# Patient Record
Sex: Male | Born: 1945 | Race: White | Hispanic: No | Smoking: Never smoker
Health system: Southern US, Community
[De-identification: ages and names within clinical notes are randomized; demographics above are authoritative.]

---

## 2001-03-20 ENCOUNTER — Emergency Department (HOSPITAL_COMMUNITY): Admission: EM | Admit: 2001-03-20 | Discharge: 2001-03-20 | Payer: Self-pay | Admitting: Emergency Medicine

## 2001-03-20 ENCOUNTER — Encounter: Payer: Self-pay | Admitting: Emergency Medicine

## 2015-07-03 ENCOUNTER — Other Ambulatory Visit: Payer: Self-pay | Admitting: Family Medicine

## 2015-07-03 DIAGNOSIS — Z136 Encounter for screening for cardiovascular disorders: Secondary | ICD-10-CM

## 2015-07-11 ENCOUNTER — Ambulatory Visit
Admission: RE | Admit: 2015-07-11 | Discharge: 2015-07-11 | Disposition: A | Discharge status: Home / Self Care | Payer: Commercial Managed Care - HMO | Source: Ambulatory Visit | Attending: Family Medicine | Admitting: Family Medicine

## 2015-07-11 DIAGNOSIS — Z136 Encounter for screening for cardiovascular disorders: Secondary | ICD-10-CM

## 2016-05-18 DIAGNOSIS — R69 Illness, unspecified: Secondary | ICD-10-CM | POA: Diagnosis not present

## 2016-07-17 DIAGNOSIS — I10 Essential (primary) hypertension: Secondary | ICD-10-CM | POA: Diagnosis not present

## 2016-07-17 DIAGNOSIS — E785 Hyperlipidemia, unspecified: Secondary | ICD-10-CM | POA: Diagnosis not present

## 2016-07-17 DIAGNOSIS — Z1159 Encounter for screening for other viral diseases: Secondary | ICD-10-CM | POA: Diagnosis not present

## 2016-07-17 DIAGNOSIS — Z1211 Encounter for screening for malignant neoplasm of colon: Secondary | ICD-10-CM | POA: Diagnosis not present

## 2016-07-17 DIAGNOSIS — Z0000 Encounter for general adult medical examination without abnormal findings: Secondary | ICD-10-CM | POA: Diagnosis not present

## 2016-07-17 DIAGNOSIS — Z125 Encounter for screening for malignant neoplasm of prostate: Secondary | ICD-10-CM | POA: Diagnosis not present

## 2016-07-31 DIAGNOSIS — I10 Essential (primary) hypertension: Secondary | ICD-10-CM | POA: Diagnosis not present

## 2016-08-06 IMAGING — US US AORTA SCREENING (MEDICARE)
1 series · 10 of 10 positions shown · non-contrast
Comparison: None.

CLINICAL DATA: Medicare screening exam for abdominal aortic
aneurysm.

EXAM:
ABDOMINAL AORTA SCREENING ULTRASOUND
TECHNIQUE: Ultrasound examination of the abdominal aorta was performed as a
screening evaluation for abdominal aortic aneurysm.

[Series 1: us aorta screening (medicare) · 0.25mm/px · 10 of 10 slices shown]
[im 1/10]
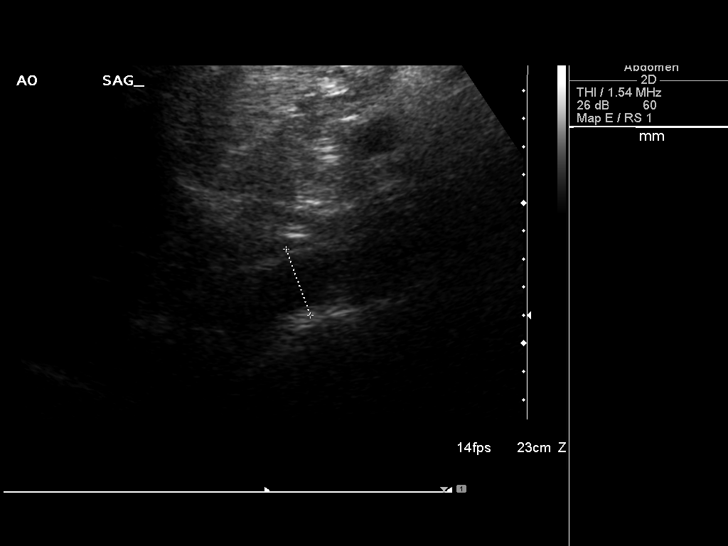
[im 2/10]
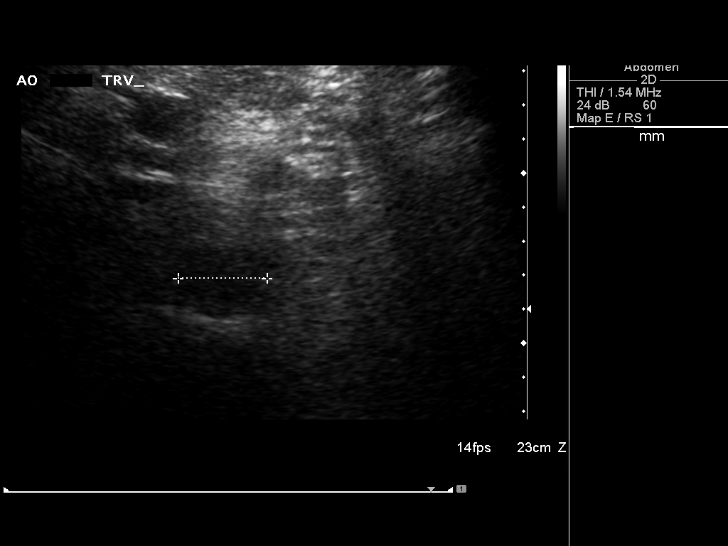
[im 3/10]
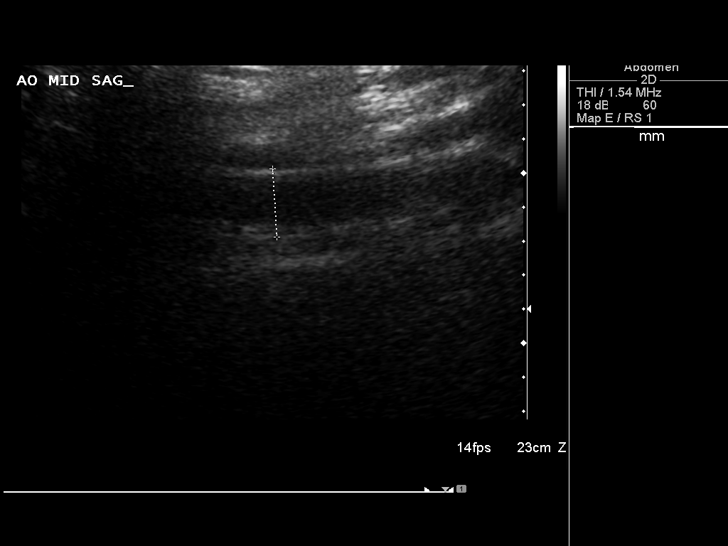
[im 4/10]
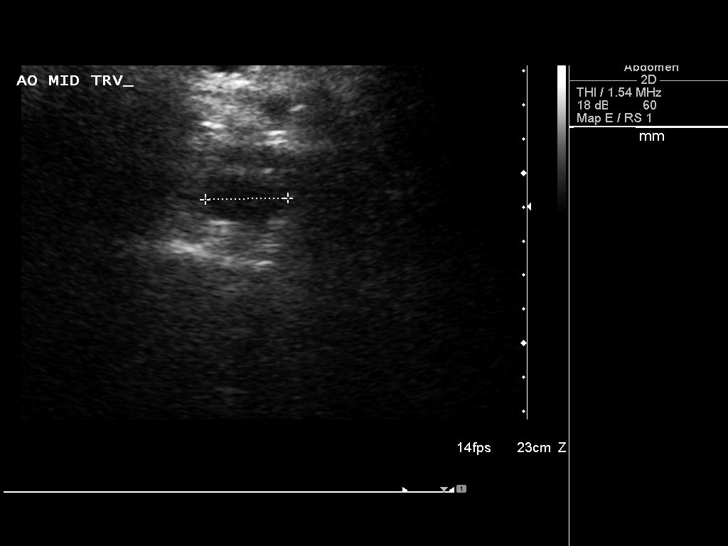
[im 5/10]
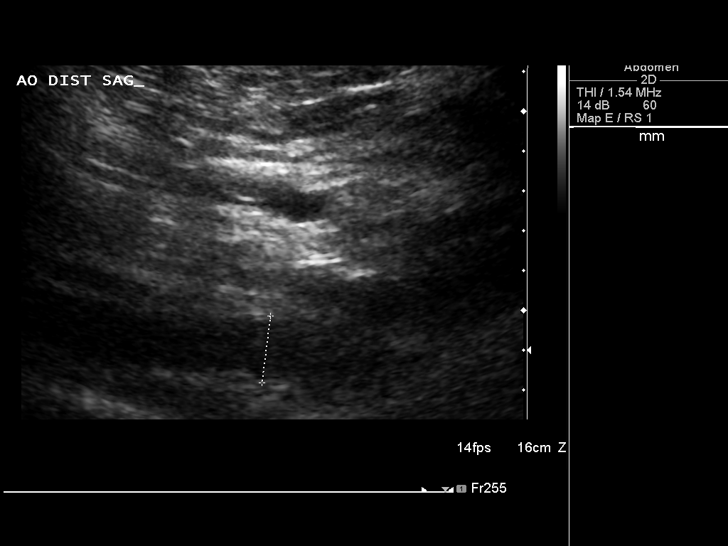
[im 6/10]
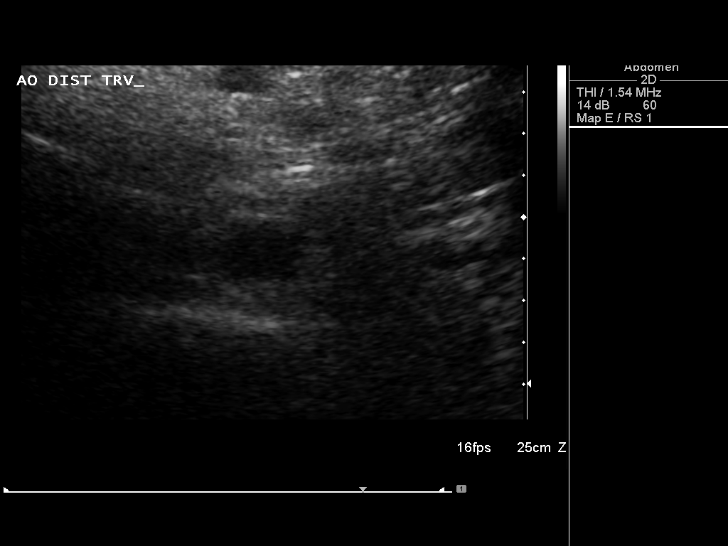
[im 7/10]
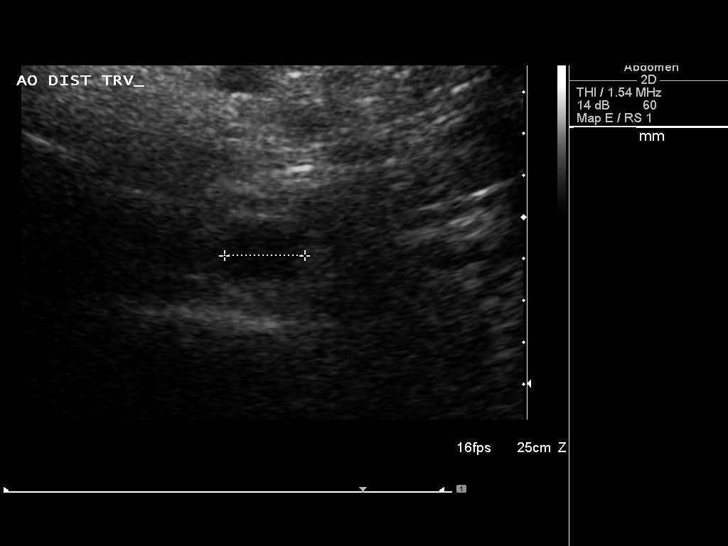
[im 8/10]
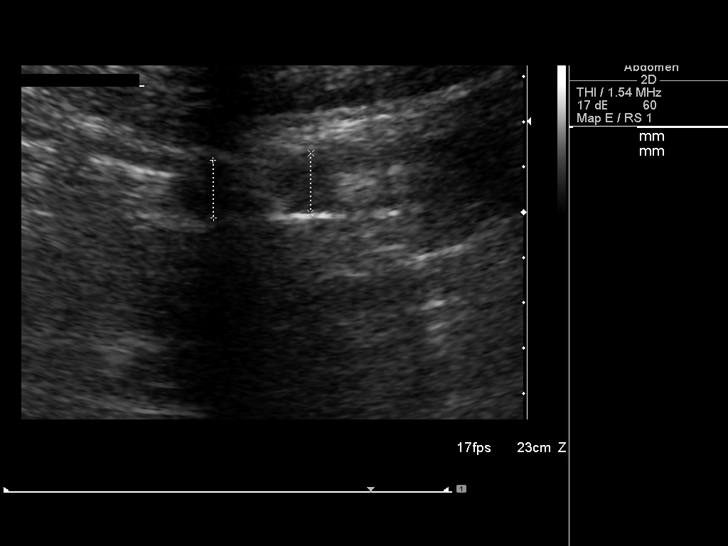
[im 9/10]
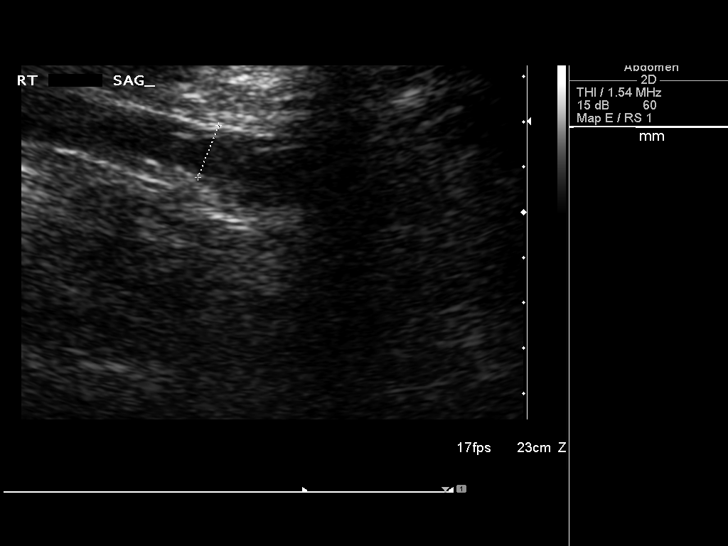
[im 10/10]
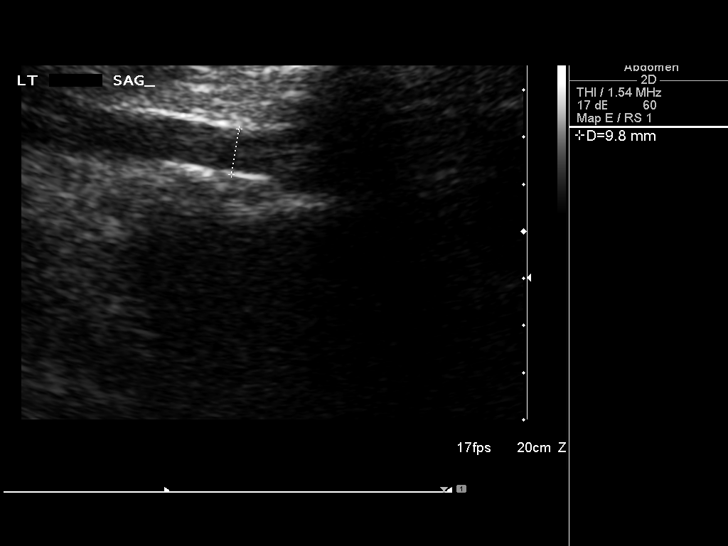

[10 of 10 positions shown; findings below may reference images not displayed]

FINDINGS: Abdominal Aorta

No aneurysm identified.

Maximum Diameter: 2.6 cm transversely seen in the proximal abdominal
aorta.

Right common iliac artery measures 1.3 cm in diameter. Left common
iliac artery measures 1.3 cm in diameter.
IMPRESSION: No evidence of abdominal aortic aneurysm.

## 2016-11-17 DIAGNOSIS — R69 Illness, unspecified: Secondary | ICD-10-CM | POA: Diagnosis not present

## 2016-12-15 DIAGNOSIS — D126 Benign neoplasm of colon, unspecified: Secondary | ICD-10-CM | POA: Diagnosis not present

## 2016-12-15 DIAGNOSIS — Z1211 Encounter for screening for malignant neoplasm of colon: Secondary | ICD-10-CM | POA: Diagnosis not present

## 2016-12-17 DIAGNOSIS — D126 Benign neoplasm of colon, unspecified: Secondary | ICD-10-CM | POA: Diagnosis not present

## 2016-12-17 DIAGNOSIS — Z1211 Encounter for screening for malignant neoplasm of colon: Secondary | ICD-10-CM | POA: Diagnosis not present

## 2017-01-28 DIAGNOSIS — I10 Essential (primary) hypertension: Secondary | ICD-10-CM | POA: Diagnosis not present

## 2017-02-15 DIAGNOSIS — R69 Illness, unspecified: Secondary | ICD-10-CM | POA: Diagnosis not present

## 2017-02-24 DIAGNOSIS — H5200 Hypermetropia, unspecified eye: Secondary | ICD-10-CM | POA: Diagnosis not present

## 2017-05-24 DIAGNOSIS — R69 Illness, unspecified: Secondary | ICD-10-CM | POA: Diagnosis not present

## 2017-10-25 DIAGNOSIS — E7800 Pure hypercholesterolemia, unspecified: Secondary | ICD-10-CM | POA: Diagnosis not present

## 2017-10-25 DIAGNOSIS — I10 Essential (primary) hypertension: Secondary | ICD-10-CM | POA: Diagnosis not present

## 2017-10-25 DIAGNOSIS — Z125 Encounter for screening for malignant neoplasm of prostate: Secondary | ICD-10-CM | POA: Diagnosis not present

## 2017-12-06 DIAGNOSIS — R69 Illness, unspecified: Secondary | ICD-10-CM | POA: Diagnosis not present

## 2018-01-27 DIAGNOSIS — R69 Illness, unspecified: Secondary | ICD-10-CM | POA: Diagnosis not present

## 2018-03-09 DIAGNOSIS — H5203 Hypermetropia, bilateral: Secondary | ICD-10-CM | POA: Diagnosis not present

## 2018-03-09 DIAGNOSIS — H2513 Age-related nuclear cataract, bilateral: Secondary | ICD-10-CM | POA: Diagnosis not present

## 2018-05-17 DIAGNOSIS — Z0000 Encounter for general adult medical examination without abnormal findings: Secondary | ICD-10-CM | POA: Diagnosis not present

## 2018-05-17 DIAGNOSIS — I10 Essential (primary) hypertension: Secondary | ICD-10-CM | POA: Diagnosis not present

## 2018-05-17 DIAGNOSIS — E7800 Pure hypercholesterolemia, unspecified: Secondary | ICD-10-CM | POA: Diagnosis not present

## 2018-06-09 DIAGNOSIS — R69 Illness, unspecified: Secondary | ICD-10-CM | POA: Diagnosis not present

## 2018-06-13 DIAGNOSIS — R69 Illness, unspecified: Secondary | ICD-10-CM | POA: Diagnosis not present

## 2018-06-28 DIAGNOSIS — R69 Illness, unspecified: Secondary | ICD-10-CM | POA: Diagnosis not present

## 2018-11-22 DIAGNOSIS — I10 Essential (primary) hypertension: Secondary | ICD-10-CM | POA: Diagnosis not present

## 2018-11-22 DIAGNOSIS — E7800 Pure hypercholesterolemia, unspecified: Secondary | ICD-10-CM | POA: Diagnosis not present

## 2018-12-15 DIAGNOSIS — R69 Illness, unspecified: Secondary | ICD-10-CM | POA: Diagnosis not present

## 2019-01-27 DIAGNOSIS — R69 Illness, unspecified: Secondary | ICD-10-CM | POA: Diagnosis not present

## 2019-05-22 DIAGNOSIS — Z0000 Encounter for general adult medical examination without abnormal findings: Secondary | ICD-10-CM | POA: Diagnosis not present

## 2019-05-22 DIAGNOSIS — E7800 Pure hypercholesterolemia, unspecified: Secondary | ICD-10-CM | POA: Diagnosis not present

## 2019-05-22 DIAGNOSIS — Z125 Encounter for screening for malignant neoplasm of prostate: Secondary | ICD-10-CM | POA: Diagnosis not present

## 2019-05-22 DIAGNOSIS — I10 Essential (primary) hypertension: Secondary | ICD-10-CM | POA: Diagnosis not present

## 2019-06-01 DIAGNOSIS — I10 Essential (primary) hypertension: Secondary | ICD-10-CM | POA: Diagnosis not present

## 2019-06-01 DIAGNOSIS — E7800 Pure hypercholesterolemia, unspecified: Secondary | ICD-10-CM | POA: Diagnosis not present

## 2019-06-01 DIAGNOSIS — Z125 Encounter for screening for malignant neoplasm of prostate: Secondary | ICD-10-CM | POA: Diagnosis not present

## 2019-08-08 DIAGNOSIS — R69 Illness, unspecified: Secondary | ICD-10-CM | POA: Diagnosis not present

## 2019-11-09 DIAGNOSIS — L308 Other specified dermatitis: Secondary | ICD-10-CM | POA: Diagnosis not present

## 2019-11-09 DIAGNOSIS — M5432 Sciatica, left side: Secondary | ICD-10-CM | POA: Diagnosis not present

## 2019-11-09 DIAGNOSIS — I10 Essential (primary) hypertension: Secondary | ICD-10-CM | POA: Diagnosis not present

## 2019-12-25 DIAGNOSIS — R69 Illness, unspecified: Secondary | ICD-10-CM | POA: Diagnosis not present

## 2020-02-15 DIAGNOSIS — R69 Illness, unspecified: Secondary | ICD-10-CM | POA: Diagnosis not present

## 2020-03-28 DIAGNOSIS — H5200 Hypermetropia, unspecified eye: Secondary | ICD-10-CM | POA: Diagnosis not present

## 2020-03-28 DIAGNOSIS — Z0100 Encounter for examination of eyes and vision without abnormal findings: Secondary | ICD-10-CM | POA: Diagnosis not present

## 2020-03-28 DIAGNOSIS — H25813 Combined forms of age-related cataract, bilateral: Secondary | ICD-10-CM | POA: Diagnosis not present

## 2020-03-29 ENCOUNTER — Ambulatory Visit: Payer: Medicare HMO | Attending: Internal Medicine

## 2020-03-29 DIAGNOSIS — Z23 Encounter for immunization: Secondary | ICD-10-CM

## 2020-03-29 NOTE — Progress Notes (Signed)
   Covid-19 Vaccination Clinic  Name:  EVERITT WENNER    MRN: [id number] DOB: Apr 16, 1946  03/29/2020  Mr. Stene was observed post Covid-19 immunization for 15 minutes without incident. He was provided with Vaccine Information Sheet and instruction to access the V-Safe system.   Mr. Tafoya was instructed to call 911 with any severe reactions post vaccine: Marland Kitchen Difficulty breathing  . Swelling of face and throat  . A fast heartbeat  . A bad rash all over body  . Dizziness and weakness   Immunizations Administered    Name Date Dose VIS Date Route   Pfizer COVID-19 Vaccine 03/29/2020  2:43 PM 0.3 mL 02/14/2020 Intramuscular   Manufacturer: [place]   Lot: [id number]   NDC: [address]-9

## 2020-05-23 DIAGNOSIS — I10 Essential (primary) hypertension: Secondary | ICD-10-CM | POA: Diagnosis not present

## 2020-05-23 DIAGNOSIS — Z125 Encounter for screening for malignant neoplasm of prostate: Secondary | ICD-10-CM | POA: Diagnosis not present

## 2020-05-23 DIAGNOSIS — E7800 Pure hypercholesterolemia, unspecified: Secondary | ICD-10-CM | POA: Diagnosis not present

## 2020-05-23 DIAGNOSIS — M5432 Sciatica, left side: Secondary | ICD-10-CM | POA: Diagnosis not present

## 2020-06-14 DIAGNOSIS — G8929 Other chronic pain: Secondary | ICD-10-CM | POA: Diagnosis not present

## 2020-06-14 DIAGNOSIS — M5442 Lumbago with sciatica, left side: Secondary | ICD-10-CM | POA: Diagnosis not present

## 2020-06-14 DIAGNOSIS — M5441 Lumbago with sciatica, right side: Secondary | ICD-10-CM | POA: Diagnosis not present

## 2020-06-25 DIAGNOSIS — R269 Unspecified abnormalities of gait and mobility: Secondary | ICD-10-CM | POA: Diagnosis not present

## 2020-06-25 DIAGNOSIS — M545 Low back pain, unspecified: Secondary | ICD-10-CM | POA: Diagnosis not present

## 2020-06-25 DIAGNOSIS — M5442 Lumbago with sciatica, left side: Secondary | ICD-10-CM | POA: Diagnosis not present

## 2020-07-04 DIAGNOSIS — R269 Unspecified abnormalities of gait and mobility: Secondary | ICD-10-CM | POA: Diagnosis not present

## 2020-07-04 DIAGNOSIS — M5442 Lumbago with sciatica, left side: Secondary | ICD-10-CM | POA: Diagnosis not present

## 2020-07-04 DIAGNOSIS — M545 Low back pain, unspecified: Secondary | ICD-10-CM | POA: Diagnosis not present

## 2021-07-01 DIAGNOSIS — B354 Tinea corporis: Secondary | ICD-10-CM | POA: Diagnosis not present

## 2021-07-01 DIAGNOSIS — I10 Essential (primary) hypertension: Secondary | ICD-10-CM | POA: Diagnosis not present

## 2021-07-21 DIAGNOSIS — I10 Essential (primary) hypertension: Secondary | ICD-10-CM | POA: Diagnosis not present

## 2021-07-21 DIAGNOSIS — E7800 Pure hypercholesterolemia, unspecified: Secondary | ICD-10-CM | POA: Diagnosis not present

## 2022-01-09 DIAGNOSIS — B360 Pityriasis versicolor: Secondary | ICD-10-CM | POA: Diagnosis not present

## 2022-01-09 DIAGNOSIS — I10 Essential (primary) hypertension: Secondary | ICD-10-CM | POA: Diagnosis not present

## 2022-01-09 DIAGNOSIS — Z0000 Encounter for general adult medical examination without abnormal findings: Secondary | ICD-10-CM | POA: Diagnosis not present

## 2022-05-08 DIAGNOSIS — L209 Atopic dermatitis, unspecified: Secondary | ICD-10-CM | POA: Diagnosis not present

## 2022-06-10 DIAGNOSIS — H52223 Regular astigmatism, bilateral: Secondary | ICD-10-CM | POA: Diagnosis not present

## 2022-07-01 DIAGNOSIS — Z86010 Personal history of colonic polyps: Secondary | ICD-10-CM | POA: Diagnosis not present

## 2022-07-01 DIAGNOSIS — R21 Rash and other nonspecific skin eruption: Secondary | ICD-10-CM | POA: Diagnosis not present

## 2022-07-03 DIAGNOSIS — R21 Rash and other nonspecific skin eruption: Secondary | ICD-10-CM | POA: Diagnosis not present

## 2022-07-10 DIAGNOSIS — E7800 Pure hypercholesterolemia, unspecified: Secondary | ICD-10-CM | POA: Diagnosis not present

## 2022-07-10 DIAGNOSIS — I10 Essential (primary) hypertension: Secondary | ICD-10-CM | POA: Diagnosis not present

## 2022-07-10 DIAGNOSIS — L209 Atopic dermatitis, unspecified: Secondary | ICD-10-CM | POA: Diagnosis not present

## 2022-07-14 ENCOUNTER — Ambulatory Visit: Payer: Medicare HMO | Admitting: Dermatology

## 2022-07-14 ENCOUNTER — Other Ambulatory Visit: Payer: Self-pay | Admitting: Dermatology

## 2022-07-14 ENCOUNTER — Encounter: Payer: Self-pay | Admitting: Dermatology

## 2022-07-14 VITALS — BP 133/84 | HR 88

## 2022-07-14 DIAGNOSIS — L299 Pruritus, unspecified: Secondary | ICD-10-CM

## 2022-07-14 DIAGNOSIS — R21 Rash and other nonspecific skin eruption: Secondary | ICD-10-CM | POA: Diagnosis not present

## 2022-07-14 MED ORDER — TRIAMCINOLONE ACETONIDE 0.1 % EX CREA: TOPICAL_CREAM | CUTANEOUS | 0 refills | Status: DC

## 2022-07-14 NOTE — Patient Instructions (Addendum)
Treatment to start today - Start TMC 0.1% cream twice daily for up to 3 weeks. Avoid applying to face, groin, and axilla. Use as directed. Long-term use can cause thinning of the skin.  Topical steroids (such as triamcinolone, fluocinolone, fluocinonide, mometasone, clobetasol, halobetasol, betamethasone, hydrocortisone) can cause thinning and lightening of the skin if they are used for too long in the same area. Your physician has selected the right strength medicine for your problem and area affected on the body. Please use your medication only as directed by your physician to prevent side effects.       Due to recent changes in healthcare laws, you may see results of your pathology and/or laboratory studies on MyChart before the doctors have had a chance to review them. We understand that in some cases there may be results that are confusing or concerning to you. Please understand that not all results are received at the same time and often the doctors may need to interpret multiple results in order to provide you with the best plan of care or course of treatment. Therefore, we ask that you please give [place] 2 business days to thoroughly review all your results before contacting the office for clarification. Should we see a critical lab result, you will be contacted sooner.   If You Need Anything After Your Visit  If you have any questions or concerns for your doctor, please call our main line at [phone] If no one answers, please leave a voicemail as directed and we will return your call as soon as possible. Messages left after 4 pm will be answered the following business day.   You may also send [place] a message via [place]. We typically respond to MyChart messages within 1-2 business days.  For prescription refills, please ask your pharmacy to contact our office. Our fax number is [phone].  If you have an urgent issue when the clinic is closed that cannot wait until the next business day,  you can page your doctor at the number below.    Please note that while we do our best to be available for urgent issues outside of office hours, we are not available 24/7.   If you have an urgent issue and are unable to reach [place], you may choose to seek medical care at your doctor's office, retail clinic, urgent care center, or emergency room.  If you have a medical emergency, please immediately call 911 or go to the emergency department. In the event of inclement weather, please call our main line at [phone] for an update on the status of any delays or closures.  Dermatology Medication Tips: Please keep the boxes that topical medications come in in order to help keep track of the instructions about where and how to use these. Pharmacies typically print the medication instructions only on the boxes and not directly on the medication tubes.   If your medication is too expensive, please contact our office at [phone] or send [place] a message through [place].   We are unable to tell what your co-pay for medications will be in advance as this is different depending on your insurance coverage. However, we may be able to find a substitute medication at lower cost or fill out paperwork to get insurance to cover a needed medication.   If a prior authorization is required to get your medication covered by your insurance company, please allow [place] 1-2 business days to complete this process.  Drug prices often vary depending on where  the prescription is filled and some pharmacies may offer cheaper prices.  The website www.goodrx.com contains coupons for medications through different pharmacies. The prices here do not account for what the cost may be with help from insurance (it may be cheaper with your insurance), but the website can give you the price if you did not use any insurance.  - You can print the associated coupon and take it with your prescription to the pharmacy.  - You may also stop by our  office during regular business hours and pick up a GoodRx coupon card.  - If you need your prescription sent electronically to a different pharmacy, notify our office through [facility] or by phone at [phone]

## 2022-07-14 NOTE — Progress Notes (Unsigned)
   New Patient Visit  Subjective  Ricky Rose is a 77 y.o. male who presents for the following: Rash (Chest, neck, arms and legs since last fall, started at legs and has spread, itchy. Patient has been seeing PCP and has been on prednisone, Xyzal, ketoconazole tablets, hydroxyzine. PCP had patient stop all medications a week ago this past Friday but then restarted lisinopril last Friday. Using CeraVe topically. ).  On a scale from 1-10 patient advises itching is close to a 10. No recent weight loss, no fhx psoriasis.  The following portions of the chart were reviewed this encounter and updated as appropriate:   Allergies  Meds  Med Hx  Surg Hx      Review of Systems:  No other skin or systemic complaints except as noted in HPI or Assessment and Plan.  Objective  Well appearing patient in no apparent distress; mood and affect are within normal limits.  [id number] full examination was performed including scalp, head, eyes, ears, nose, lips, neck, chest, axillae, abdomen, back, buttocks, bilateral upper extremities, bilateral lower extremities, hands, feet, fingers, toes, fingernails, and toenails. All findings within normal limits unless otherwise noted below."}  right abdomen Diffuse erythema and scale     arms, trunk, legs, face           Assessment & Plan  Rash right abdomen  Pending biopsy results consider biologic vs Dupixent vs other.     Skin / nail biopsy - right abdomen Type of biopsy: punch   Informed consent: discussed and consent obtained   Timeout: patient name, date of birth, surgical site, and procedure verified   Procedure prep:  Patient was prepped and draped in usual sterile fashion (the patient was cleaned and prepped) Prep type:  Isopropyl alcohol Anesthesia: the lesion was anesthetized in a standard fashion   Anesthetic:  1% lidocaine w/ epinephrine 1-100,000 buffered w/ 8.4% NaHCO3 Punch size:  4 mm Suture size:  4-0 Suture type:  nylon   Suture removal (days):  14 Hemostasis achieved with: suture, pressure and aluminum chloride   Outcome: patient tolerated procedure well   Post-procedure details: sterile dressing applied and wound care instructions given   Dressing type: bandage, petrolatum and pressure dressing    Specimen 1 - Surgical pathology Differential Diagnosis: Atopic dermatitis vs erythrodermic psoriasis vs CTCL  Check Margins: No Diffuse erythema and scale  Pruritus arms, trunk, legs, face  Treatment to start today - Start TMC 0.1% cream twice daily for up to 3 weeks. Avoid applying to face, groin, and axilla. Use as directed. Long-term use can cause thinning of the skin.  Topical steroids (such as triamcinolone, fluocinolone, fluocinonide, mometasone, clobetasol, halobetasol, betamethasone, hydrocortisone) can cause thinning and lightening of the skin if they are used for too long in the same area. Your physician has selected the right strength medicine for your problem and area affected on the body. Please use your medication only as directed by your physician to prevent side effects.    triamcinolone cream (KENALOG) 0.1 % - arms, trunk, legs, face Apply twice daily as needed for itch for up to 3 weeks. Avoid applying to face, groin, and axilla. Use as directed. Long-term use can cause thinning of the skin.  Related Procedures CBC with Differential/Platelets CMP IGE QuantiFERON-TB Gold Plus   Return in about 2 weeks (around 07/28/2022) for Suture Removal.  Graciella Belton, RMA, am acting as scribe for Ellard Artis, MD .

## 2022-07-16 ENCOUNTER — Encounter: Payer: Self-pay | Admitting: Dermatology

## 2022-07-17 LAB — CBC WITH DIFFERENTIAL/PLATELET
Basophils Absolute: 0 10*3/uL (ref 0.0–0.2)
Basos: 0 %
EOS (ABSOLUTE): 0.2 10*3/uL (ref 0.0–0.4)
Eos: 2 %
Hematocrit: 49.6 % (ref 37.5–51.0)
Hemoglobin: 16.6 g/dL (ref 13.0–17.7)
Immature Grans (Abs): 0 10*3/uL (ref 0.0–0.1)
Immature Granulocytes: 0 %
Lymphocytes Absolute: 0.9 10*3/uL (ref 0.7–3.1)
Lymphs: 10 %
MCH: 29.4 pg (ref 26.6–33.0)
MCHC: 33.5 g/dL (ref 31.5–35.7)
MCV: 88 fL (ref 79–97)
Monocytes Absolute: 0.9 10*3/uL (ref 0.1–0.9)
Monocytes: 11 %
Neutrophils Absolute: 6.2 10*3/uL (ref 1.4–7.0)
Neutrophils: 77 %
Platelets: 236 10*3/uL (ref 150–450)
RBC: 5.65 x10E6/uL (ref 4.14–5.80)
RDW: 14.3 % (ref 11.6–15.4)
WBC: 8.2 10*3/uL (ref 3.4–10.8)

## 2022-07-17 LAB — COMPREHENSIVE METABOLIC PANEL
ALT: 55 IU/L — ABNORMAL HIGH (ref 0–44)
AST: 54 IU/L — ABNORMAL HIGH (ref 0–40)
Albumin/Globulin Ratio: 1.6 (ref 1.2–2.2)
Albumin: 4.6 g/dL (ref 3.8–4.8)
Alkaline Phosphatase: 100 IU/L (ref 44–121)
BUN/Creatinine Ratio: 13 (ref 10–24)
BUN: 15 mg/dL (ref 8–27)
Bilirubin Total: 0.5 mg/dL (ref 0.0–1.2)
CO2: 19 mmol/L — ABNORMAL LOW (ref 20–29)
Calcium: 10.1 mg/dL (ref 8.6–10.2)
Chloride: 105 mmol/L (ref 96–106)
Creatinine, Ser: 1.19 mg/dL (ref 0.76–1.27)
Globulin, Total: 2.8 g/dL (ref 1.5–4.5)
Glucose: 100 mg/dL — ABNORMAL HIGH (ref 70–99)
Potassium: 5.5 mmol/L — ABNORMAL HIGH (ref 3.5–5.2)
Sodium: 147 mmol/L — ABNORMAL HIGH (ref 134–144)
Total Protein: 7.4 g/dL (ref 6.0–8.5)
eGFR: 63 mL/min/{1.73_m2} (ref >59)

## 2022-07-17 LAB — REFERENCE MICRO PROBLEM TEST

## 2022-07-17 LAB — QUANTIFERON-TB GOLD PLUS

## 2022-07-17 LAB — IGE: IgE (Immunoglobulin E), Serum: 650 IU/mL — ABNORMAL HIGH (ref 6–495)

## 2022-07-29 ENCOUNTER — Ambulatory Visit: Payer: Medicare HMO | Admitting: Dermatology

## 2022-07-29 ENCOUNTER — Encounter: Payer: Self-pay | Admitting: Dermatology

## 2022-07-29 DIAGNOSIS — L309 Dermatitis, unspecified: Secondary | ICD-10-CM

## 2022-07-29 MED ORDER — PIMECROLIMUS 1 % EX CREA: TOPICAL_CREAM | Freq: Two times a day (BID) | CUTANEOUS | 0 refills | Status: DC

## 2022-07-29 NOTE — Progress Notes (Signed)
   Follow-Up Visit   Subjective  Ricky Rose is a 77 y.o. male who presents for the following: Follow-up (Patient is here for a follow up on a rash. The itchiness and redness has gotten better with use of the Triamcinolone cream. ) and Suture / Staple Removal (Suture removal from last office vist).    The following portions of the chart were reviewed this encounter and updated as appropriate:      Review of Systems: No other skin or systemic complaints.  Objective  Well appearing patient in no apparent distress; mood and affect are within normal limits.  All skin waist up examined.  PATHOLOGY REPORT Diagnosis Skin , right abdomen ATYPICAL MONONUCLEAR CELL INFILTRATE Microscopic Description There is a proliferation of mononuclear cells in a slightly thickened papillary dermis. The cells focally abut the basement membranes and an occasional cell is present within the epidermis where there is a little spongiosis. These mononuclear cells have slight nuclear hyperchromaticity but no prominent atypia. Immunohistochemistry stains for CD3, CD4 and CD8 shows a mixed infiltrate. The CD20 is negative. No diagnostic changes of psoriasis are noted. Multiple levels taken through the submitted block are examined. The slides were also reviewed by Dr. Dalia Heading, who is in general agreement with the diagnosis. 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 Similar changes, however, can be secondary to some drug eruptions. If these lesions continue to progress or new lesions appear, perhaps additional biopsies will reveal more diagnostic features. A T-cell gene rearrangement is advised.   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  The other differential dx was drug eruption.  Pt's skin is almost completely clear after applying Triamcinolone for 2 weeks.  I recommend he complete 1 more week of triamcinolone and then follow up with tacrolimus for any small unresolved areas.  He can use tacrolimus daily on any residual itchy areas until clear.  If there is no recurrence then the diagnosis leans toward drug eruption and if there are future recurrence we will do a repeat skin bx's since MF can be one of those diagnoses that takes time to "fully present itself"  We will see him back in 6 weeks for an update. -Continue Triamcinolone cream twice a day for one more week to affected areas -Start the Elodil 1% cream twice a day for four weeks to affected areas  Biopsy Results/Suture Removal Sutures were removed from punch biopsy performed at last office visit. Patient tolerated procedure well with no complications. Biopsy results were discussed with patient. Patient verbally understood the diagnosis   Return in about 6 weeks (around 09/09/2022) for rash follow up.  I, Germaine Pomfret, CMA, am acting as scribe for Langston Reusing, MD.

## 2022-07-29 NOTE — Patient Instructions (Addendum)
Continue Triamcinolone cream twice a day for one more week then switch to Elidel cream twice a day for four weeks  Due to recent changes in healthcare laws, you may see results of your pathology and/or laboratory studies on MyChart before the doctors have had a chance to review them. We understand that in some cases there may be results that are confusing or concerning to you. Please understand that not all results are received at the same time and often the doctors may need to interpret multiple results in order to provide you with the best plan of care or course of treatment. Therefore, we ask that you please give [place] 2 business days to thoroughly review all your results before contacting the office for clarification. Should we see a critical lab result, you will be contacted sooner.   If You Need Anything After Your Visit  If you have any questions or concerns for your doctor, please call our main line at [phone] If no one answers, please leave a voicemail as directed and we will return your call as soon as possible. Messages left after 4 pm will be answered the following business day.   You may also send [place] a message via [place]. We typically respond to MyChart messages within 1-2 business days.  For prescription refills, please ask your pharmacy to contact our office. Our fax number is [phone].  If you have an urgent issue when the clinic is closed that cannot wait until the next business day, you can page your doctor at the number below.    Please note that while we do our best to be available for urgent issues outside of office hours, we are not available 24/7.   If you have an urgent issue and are unable to reach [place], you may choose to seek medical care at your doctor's office, retail clinic, urgent care center, or emergency room.  If you have a medical emergency, please immediately call 911 or go to the emergency department. In the event of inclement weather, please call our main  line at [phone] for an update on the status of any delays or closures.  Dermatology Medication Tips: Please keep the boxes that topical medications come in in order to help keep track of the instructions about where and how to use these. Pharmacies typically print the medication instructions only on the boxes and not directly on the medication tubes.   If your medication is too expensive, please contact our office at [phone] or send [place] a message through [place].   We are unable to tell what your co-pay for medications will be in advance as this is different depending on your insurance coverage. However, we may be able to find a substitute medication at lower cost or fill out paperwork to get insurance to cover a needed medication.   If a prior authorization is required to get your medication covered by your insurance company, please allow [place] 1-2 business days to complete this process.  Drug prices often vary depending on where the prescription is filled and some pharmacies may offer cheaper prices.  The website www.goodrx.com contains coupons for medications through different pharmacies. The prices here do not account for what the cost may be with help from insurance (it may be cheaper with your insurance), but the website can give you the price if you did not use any insurance.  - You can print the associated coupon and take it with your prescription to the pharmacy.  - You may  also stop by our office during regular business hours and pick up a GoodRx coupon card.  - If you need your prescription sent electronically to a different pharmacy, notify our office through [facility] or by phone at [phone]

## 2022-08-05 ENCOUNTER — Other Ambulatory Visit: Payer: Self-pay | Admitting: Dermatology

## 2022-08-06 ENCOUNTER — Telehealth: Payer: Self-pay | Admitting: Dermatology

## 2022-08-06 NOTE — Telephone Encounter (Signed)
Yes, ok to change to tacrolimus 0.1% ointment.

## 2022-08-06 NOTE — Telephone Encounter (Signed)
Needs an alternative RX called in to [facility] on [address]- message did not specify which RX.

## 2022-08-06 NOTE — Telephone Encounter (Signed)
Yes, we received a fax about this today and sent over a new script.  I think this was an older message :)

## 2022-08-12 NOTE — Progress Notes (Signed)
This note is for documentation purposes. Pt is aware of results as they were reviewed in office with him after having the pathology report faxed directly to the office.  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  T-cell gene rearrangement was recommended.  I called the pathologist and confirmed this could be performed with the current specimen thus no repeat bx is necessary.

## 2022-09-08 ENCOUNTER — Encounter: Payer: Self-pay | Admitting: Dermatology

## 2022-09-09 ENCOUNTER — Encounter: Payer: Self-pay | Admitting: Dermatology

## 2022-09-09 ENCOUNTER — Ambulatory Visit: Payer: Medicare HMO | Admitting: Dermatology

## 2022-09-09 VITALS — BP 124/83

## 2022-09-09 DIAGNOSIS — L219 Seborrheic dermatitis, unspecified: Secondary | ICD-10-CM

## 2022-09-09 DIAGNOSIS — L309 Dermatitis, unspecified: Secondary | ICD-10-CM | POA: Diagnosis not present

## 2022-09-09 MED ORDER — PREDNISONE 10 MG PO TABS: ORAL_TABLET | ORAL | 0 refills | Status: AC

## 2022-09-09 MED ORDER — NYSTATIN-TRIAMCINOLONE 100000-0.1 UNIT/GM-% EX OINT: 1.0000 | TOPICAL_OINTMENT | Freq: Two times a day (BID) | CUTANEOUS | 0 refills | Status: DC

## 2022-09-09 MED ORDER — TRIAMCINOLONE ACETONIDE 0.1 % EX CREA: 1.0000 | TOPICAL_CREAM | Freq: Two times a day (BID) | CUTANEOUS | 0 refills | Status: DC

## 2022-09-09 NOTE — Patient Instructions (Addendum)
Treatment Plan: Resume Triamcinolone cream 2 x daily for 2 weeks then stop. He can use Cerave anti-itch cream after stopping.  Discontinue Lisinopril for 1 month. He has a blood pressure cuff at home and should monitor his blood pressure daily.     Due to recent changes in healthcare laws, you may see results of your pathology and/or laboratory studies on MyChart before the doctors have had a chance to review them. We understand that in some cases there may be results that are confusing or concerning to you. Please understand that not all results are received at the same time and often the doctors may need to interpret multiple results in order to provide you with the best plan of care or course of treatment. Therefore, we ask that you please give [place] 2 business days to thoroughly review all your results before contacting the office for clarification. Should we see a critical lab result, you will be contacted sooner.   If You Need Anything After Your Visit  If you have any questions or concerns for your doctor, please call our main line at [phone] If no one answers, please leave a voicemail as directed and we will return your call as soon as possible. Messages left after 4 pm will be answered the following business day.   You may also send [place] a message via MyChart. We typically respond to MyChart messages within 1-2 business days.  For prescription refills, please ask your pharmacy to contact our office. Our fax number is [phone].  If you have an urgent issue when the clinic is closed that cannot wait until the next business day, you can page your doctor at the number below.    Please note that while we do our best to be available for urgent issues outside of office hours, we are not available 24/7.   If you have an urgent issue and are unable to reach [place], you may choose to seek medical care at your doctor's office, retail clinic, urgent care center, or emergency room.  If you have  a medical emergency, please immediately call 911 or go to the emergency department. In the event of inclement weather, please call our main line at [phone] for an update on the status of any delays or closures.  Dermatology Medication Tips: Please keep the boxes that topical medications come in in order to help keep track of the instructions about where and how to use these. Pharmacies typically print the medication instructions only on the boxes and not directly on the medication tubes.   If your medication is too expensive, please contact our office at [phone] or send [place] a message through MyChart.   We are unable to tell what your co-pay for medications will be in advance as this is different depending on your insurance coverage. However, we may be able to find a substitute medication at lower cost or fill out paperwork to get insurance to cover a needed medication.   If a prior authorization is required to get your medication covered by your insurance company, please allow [place] 1-2 business days to complete this process.  Drug prices often vary depending on where the prescription is filled and some pharmacies may offer cheaper prices.  The website www.goodrx.com contains coupons for medications through different pharmacies. The prices here do not account for what the cost may be with help from insurance (it may be cheaper with your insurance), but the website can give you the price if you did not  use any insurance.  - You can print the associated coupon and take it with your prescription to the pharmacy.  - You may also stop by our office during regular business hours and pick up a GoodRx coupon card.  - If you need your prescription sent electronically to a different pharmacy, notify our office through [facility] or by phone at [phone]

## 2022-09-09 NOTE — Progress Notes (Signed)
   Follow-Up Visit   Subjective  Ricky Rose is a 77 y.o. 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 He thinks the Triamcinolone was giving more improvement. It is 5 on the itch scale. Arms, legs, back. Ongoing since 01/2022   The following portions of the chart were reviewed this encounter and updated as appropriate: medications, allergies, medical history  Review of Systems:  No other skin or systemic complaints except as noted in HPI or Assessment and Plan.  Objective  Well appearing patient in no apparent distress; mood and affect are within normal limits.  .  A focused examination was performed of the following areas: Arms, legs, torso, face  Relevant exam findings are noted in the Assessment and Plan.    Assessment & Plan   SEBORRHEIC DERMATITIS Exam: Pink patches with greasy scale at the face  Not at goal  Seborrheic Dermatitis is a chronic persistent rash characterized by pinkness and scaling most commonly of the mid face but also can occur on the scalp (dandruff), ears; mid chest, mid back and groin.  It tends to be exacerbated by stress and cooler weather.  People who have neurologic disease may experience new onset or exacerbation of existing seborrheic dermatitis.  The condition is not curable but treatable and can be controlled.  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  Few faint erythematous patches.   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 He is taking Lisinopril and blood pressure is well controlled. Discontinue Lisinopril for 1 month. He has a blood pressure cuff at home and should monitor his blood  pressure daily.  Treatment Plan: Resume Triamcinolone cream 2 x daily for 2 weeks then stop. He can use Cerave anti-itch cream after stopping.  Discontinue Lisinopril for 1 month. He has a blood pressure cuff at home and should monitor his blood pressure daily.  Reviewed blood work results. Discussed Prednisone treatment and precautions while taking which include monitoring sodium and caffeine intake.  Return in about 1 month (around 10/10/2022) for Seborrheic Dermatitis Follow Up, Derrmatitis .  Jaclynn Guarneri, CMA, am acting as scribe for [employer], DO.   Documentation: I have reviewed the above documentation for accuracy and completeness, and I agree with the above.  Langston Reusing, DO

## 2022-09-15 ENCOUNTER — Telehealth: Payer: Self-pay | Admitting: Dermatology

## 2022-09-15 NOTE — Telephone Encounter (Signed)
Hi Michele,  We saw this patient last week for a follow up.  Can you call the pharmacy and verify what the question is regarding the rx in question.

## 2022-09-15 NOTE — Telephone Encounter (Signed)
Pharmacy left VM regarding question about qty of RX. This may have been addressed. Message was on Clinical VM.

## 2022-09-27 ENCOUNTER — Other Ambulatory Visit: Payer: Self-pay | Admitting: Dermatology

## 2022-10-05 ENCOUNTER — Other Ambulatory Visit: Payer: Self-pay | Admitting: Dermatology

## 2022-10-06 ENCOUNTER — Other Ambulatory Visit: Payer: Self-pay

## 2022-10-06 MED ORDER — KETOCONAZOLE 2 % EX CREA: 1.0000 | TOPICAL_CREAM | Freq: Two times a day (BID) | CUTANEOUS | 0 refills | Status: AC

## 2022-10-06 MED ORDER — HYDROCORTISONE 2.5 % EX CREA: TOPICAL_CREAM | Freq: Two times a day (BID) | CUTANEOUS | 11 refills | Status: DC | PRN

## 2022-10-12 ENCOUNTER — Ambulatory Visit: Payer: Medicare HMO | Admitting: Dermatology

## 2022-10-22 ENCOUNTER — Ambulatory Visit: Payer: Medicare HMO | Admitting: Dermatology

## 2022-11-09 ENCOUNTER — Encounter: Payer: Self-pay | Admitting: Dermatology

## 2022-11-09 ENCOUNTER — Ambulatory Visit: Payer: Medicare HMO | Admitting: Dermatology

## 2022-11-09 VITALS — BP 117/73 | HR 78

## 2022-11-09 DIAGNOSIS — L219 Seborrheic dermatitis, unspecified: Secondary | ICD-10-CM

## 2022-11-09 DIAGNOSIS — R21 Rash and other nonspecific skin eruption: Secondary | ICD-10-CM | POA: Diagnosis not present

## 2022-11-09 DIAGNOSIS — L299 Pruritus, unspecified: Secondary | ICD-10-CM | POA: Diagnosis not present

## 2022-11-09 DIAGNOSIS — C8400 Mycosis fungoides, unspecified site: Secondary | ICD-10-CM | POA: Diagnosis not present

## 2022-11-09 NOTE — Progress Notes (Signed)
Follow Up Visit   Subjective  Ricky Rose is a 77 y.o. 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 8 week follow up. Torso, arms, legs.  Has noticed some more improvement. Used Triamcinolone cream as directed. Using CeraVe Itch Relief cream as directed on arms and legs. States arms are still a pinkish color, denies itching.  Stopped Lisinopril for 1 month then re-started. Patient reports no changes with rash during this process.   The following portions of the chart were reviewed this encounter and updated as appropriate: medications, allergies, medical history  Review of Systems:  No other skin or systemic complaints except as noted in HPI or Assessment and Plan.  Objective  Well appearing patient in no apparent distress; mood and affect are within normal limits.  A focused examination was performed of the following areas: Arms, legs: Bilateral arms display mildly scaly erythematous plaques. Abdomen exhibits dryness and mild erythema without significant plaques. Improvement noted on the back.   - Seborrheic Dermatitis: Presence of greasy scaling and redness on eyebrows and nose, with a slight flare observed.  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  Relevant exam findings are noted in the Assessment and Plan.          Assessment & Plan   Rash  Related Procedures CBC with Differential/Platelet  Pruritus  Related Procedures CBC with Differential/Platelet  Related Medications triamcinolone cream (KENALOG) 0.1 % Apply twice daily as needed for itch for up to 3 weeks. Avoid applying to face, groin, and axilla. Use as directed. Long-term use can cause thinning of the skin.    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  Chronic and persistent condition with duration or expected duration over one year. Condition is symptomatic/ bothersome to patient. Not currently at goal.   - Assessment: Continue management of Mycosis Fungoides with current regimen. - Plan: Continue using Triamcinolone 0.1% cream twice a day for two weeks, then switch to CeraVe Anti-Itch for two weeks. Alternate between the two creams until the skin is smooth. Monitor for any worsening or progression of the condition. Follow up in two months to assess the effectiveness of the treatment. Obtain lab work to check white blood cell count.  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 Condition is symptomatic/ bothersome to patient. Not currently at goal.    Seborrheic Dermatitis is a chronic persistent rash characterized by pinkness and scaling most commonly of the mid face but also can occur on the scalp (dandruff), ears; mid chest, mid back and groin.  It tends to be exacerbated by stress and cooler weather.  People who have neurologic disease may experience new onset or exacerbation of existing seborrheic dermatitis.  The condition is not curable but treatable and can be controlled.   Treatment Plan: Nystatin/Triamcinolone cream 2 x daily for 7 days then stop. Re-start as needed for flares.   Return in about 2 months (around 01/10/2023) for Rash Follow Up.  I, Lawson Radar, CMA, am acting as scribe for [employer], DO.   Documentation: I have reviewed the above documentation for accuracy and completeness, and I agree with the above.  Langston Reusing, DO

## 2022-11-09 NOTE — Patient Instructions (Addendum)
Thank you for visiting the clinic today. I appreciate your dedication to managing your condition and am pleased to hear about the improvements in your skin.  Here are the key instructions from today's consultation:  - Triamcinolone 0.1% Cream: Apply twice daily for two weeks on affected areas on legs, arms and trunk. After two weeks, switch to CeraVe Anti-Itch for two weeks. Continue alternating every two weeks.  - Nystatin Triamcinolone Cream: For flares of Seborrheic Dermatitis on your eyebrows and nose, apply morning and night for 7-10 days as needed.  - Lab Work: We will conduct a lab test to check your white blood cell count to monitor your condition.  - Follow-Up Appointment: Please return in two months to assess the effectiveness of the treatment. If your skin has not improved as expected, we will consider additional treatment options.  - Medication Usage: Continue using the prescribed creams as directed to manage symptoms and prevent flare-ups.  It's important to keep a close eye on your skin and report any new or worsening symptoms. If you have any concerns or notice any changes before your next appointment, please do not hesitate to contact our office.  Thank you for your cooperation and keep up the great work managing your health.   Due to recent changes in healthcare laws, you may see results of your pathology and/or laboratory studies on MyChart before the doctors have had a chance to review them. We understand that in some cases there may be results that are confusing or concerning to you. Please understand that not all results are received at the same time and often the doctors may need to interpret multiple results in order to provide you with the best plan of care or course of treatment. Therefore, we ask that you please give [place] 2 business days to thoroughly review all your results before contacting the office for clarification. Should we see a critical lab result, you will be  contacted sooner.   If You Need Anything After Your Visit  If you have any questions or concerns for your doctor, please call our main line at [phone] If no one answers, please leave a voicemail as directed and we will return your call as soon as possible. Messages left after 4 pm will be answered the following business day.   You may also send [place] a message via MyChart. We typically respond to MyChart messages within 1-2 business days.  For prescription refills, please ask your pharmacy to contact our office. Our fax number is [phone].  If you have an urgent issue when the clinic is closed that cannot wait until the next business day, you can page your doctor at the number below.    Please note that while we do our best to be available for urgent issues outside of office hours, we are not available 24/7.   If you have an urgent issue and are unable to reach [place], you may choose to seek medical care at your doctor's office, retail clinic, urgent care center, or emergency room.  If you have a medical emergency, please immediately call 911 or go to the emergency department. In the event of inclement weather, please call our main line at [phone] for an update on the status of any delays or closures.  Dermatology Medication Tips: Please keep the boxes that topical medications come in in order to help keep track of the instructions about where and how to use these. Pharmacies typically print the medication instructions only on the  boxes and not directly on the medication tubes.   If your medication is too expensive, please contact our office at [phone] or send [place] a message through MyChart.   We are unable to tell what your co-pay for medications will be in advance as this is different depending on your insurance coverage. However, we may be able to find a substitute medication at lower cost or fill out paperwork to get insurance to cover a needed medication.   If a prior  authorization is required to get your medication covered by your insurance company, please allow [place] 1-2 business days to complete this process.  Drug prices often vary depending on where the prescription is filled and some pharmacies may offer cheaper prices.  The website www.goodrx.com contains coupons for medications through different pharmacies. The prices here do not account for what the cost may be with help from insurance (it may be cheaper with your insurance), but the website can give you the price if you did not use any insurance.  - You can print the associated coupon and take it with your prescription to the pharmacy.  - You may also stop by our office during regular business hours and pick up a GoodRx coupon card.  - If you need your prescription sent electronically to a different pharmacy, notify our office through [facility] or by phone at [phone]

## 2022-11-12 ENCOUNTER — Encounter: Payer: Self-pay | Admitting: Dermatology

## 2022-11-12 DIAGNOSIS — L299 Pruritus, unspecified: Secondary | ICD-10-CM | POA: Diagnosis not present

## 2022-11-12 DIAGNOSIS — R21 Rash and other nonspecific skin eruption: Secondary | ICD-10-CM | POA: Diagnosis not present

## 2022-11-12 LAB — CBC WITH DIFFERENTIAL/PLATELET
Basophils Absolute: 0 10*3/uL (ref 0.0–0.2)
Basos: 1 %
EOS (ABSOLUTE): 0.3 10*3/uL (ref 0.0–0.4)
Eos: 6 %
Hematocrit: 50 % (ref 37.5–51.0)
Hemoglobin: 16.1 g/dL (ref 13.0–17.7)
Immature Grans (Abs): 0 10*3/uL (ref 0.0–0.1)
Immature Granulocytes: 0 %
Lymphocytes Absolute: 0.8 10*3/uL (ref 0.7–3.1)
Lymphs: 15 %
MCH: 27.9 pg (ref 26.6–33.0)
MCHC: 32.2 g/dL (ref 31.5–35.7)
MCV: 87 fL (ref 79–97)
Monocytes Absolute: 0.6 10*3/uL (ref 0.1–0.9)
Monocytes: 11 %
Neutrophils Absolute: 3.7 10*3/uL (ref 1.4–7.0)
Neutrophils: 67 %
Platelets: 227 10*3/uL (ref 150–450)
RBC: 5.77 x10E6/uL (ref 4.14–5.80)
RDW: 13.3 % (ref 11.6–15.4)
WBC: 5.5 10*3/uL (ref 3.4–10.8)

## 2022-11-19 NOTE — Progress Notes (Signed)
Hi Lanell Matar news, Your labs were all within normal limits.  We will continue with the current treatment plan and discuss next steps at you follow up apt.  Have a great day!  Best,  Dr. Onalee Hua

## 2023-01-14 DIAGNOSIS — Z0000 Encounter for general adult medical examination without abnormal findings: Secondary | ICD-10-CM | POA: Diagnosis not present

## 2023-01-14 DIAGNOSIS — L309 Dermatitis, unspecified: Secondary | ICD-10-CM | POA: Diagnosis not present

## 2023-01-14 DIAGNOSIS — L989 Disorder of the skin and subcutaneous tissue, unspecified: Secondary | ICD-10-CM | POA: Diagnosis not present

## 2023-01-14 DIAGNOSIS — I10 Essential (primary) hypertension: Secondary | ICD-10-CM | POA: Diagnosis not present

## 2023-01-18 ENCOUNTER — Encounter: Payer: Self-pay | Admitting: Dermatology

## 2023-01-18 ENCOUNTER — Ambulatory Visit (INDEPENDENT_AMBULATORY_CARE_PROVIDER_SITE_OTHER): Payer: Medicare HMO | Admitting: Dermatology

## 2023-01-18 VITALS — BP 111/74

## 2023-01-18 DIAGNOSIS — C8400 Mycosis fungoides, unspecified site: Secondary | ICD-10-CM | POA: Diagnosis not present

## 2023-01-18 DIAGNOSIS — C44722 Squamous cell carcinoma of skin of right lower limb, including hip: Secondary | ICD-10-CM | POA: Diagnosis not present

## 2023-01-18 DIAGNOSIS — D485 Neoplasm of uncertain behavior of skin: Secondary | ICD-10-CM

## 2023-01-18 NOTE — Patient Instructions (Addendum)
Hello Praneel,  Thank you for visiting [place] today. We appreciate your commitment to managing your health and continuing your treatment plan. Here is a summary of the key instructions from today's consultation:  - CeraVe Anti-Itch: Continue using for mild dryness on the trunk.  - Triamcinolone Cream: Apply to your legs for two weeks on, followed by two weeks off.  - Biopsy: A biopsy was performed on the nodule on your right lateral thigh and the crusty papule on your lateral thumb. We will contact you with the results in about a week.   - Follow-Up: If the biopsy results indicate skin cancer, you will be referred to our dermatologic surgeon for further treatment.  - Skin Monitoring: Continue monitoring your skin, and if there are any changes or if the condition of your legs does not improve, we may consider advancing to light therapy.  - Next Appointment: Your next follow-up appointment is scheduled for three months from now, which will be around December, before the holidays.  Please feel free to reach out if you have any questions or concerns in the meantime.  Warm regards,  Dr. Langston Reusing Dermatology   Patient Handout: Wound Care for Skin Biopsy Site  Taking Care of Your Skin Biopsy Site  Proper care of the biopsy site is essential for promoting healing and minimizing scarring. This handout provides instructions on how to care for your biopsy site to ensure optimal recovery.  1. Cleaning the Wound:  Clean the biopsy site daily with gentle soap and water. Gently pat the area dry with a clean, soft towel. Avoid harsh scrubbing or rubbing the area, as this can irritate the skin and delay healing.  2. Applying Aquaphor and Bandage:  After cleaning the wound, apply a thin layer of Aquaphor ointment to the biopsy site. Cover the area with a sterile bandage to protect it from dirt, bacteria, and friction. Change the bandage daily or as needed if it becomes soiled or wet.  3.  Continued Care for One Week:  Repeat the cleaning, Aquaphor application, and bandaging process daily for one week following the biopsy procedure. Keeping the wound clean and moist during this initial healing period will help prevent infection and promote optimal healing.  4. Massaging Aquaphor into the Area:  ---After one week, discontinue the use of bandages but continue to apply Aquaphor to the biopsy site. ----Gently massage the Aquaphor into the area using circular motions. ---Massaging the skin helps to promote circulation and prevent the formation of scar tissue.   Additional Tips:  Avoid exposing the biopsy site to direct sunlight during the healing process, as this can cause hyperpigmentation or worsen scarring. If you experience any signs of infection, such as increased redness, swelling, warmth, or drainage from the wound, contact your healthcare provider immediately. Follow any additional instructions provided by your healthcare provider for caring for the biopsy site and managing any discomfort. Conclusion:  Taking proper care of your skin biopsy site is crucial for ensuring optimal healing and minimizing scarring. By following these instructions for cleaning, applying Aquaphor, and massaging the area, you can promote a smooth and successful recovery. If you have any questions or concerns about caring for your biopsy site, don't hesitate to contact your healthcare provider for guidance.   Due to recent changes in healthcare laws, you may see results of your pathology and/or laboratory studies on MyChart before the doctors have had a chance to review them. We understand that in some cases there may be results  that are confusing or concerning to you. Please understand that not all results are received at the same time and often the doctors may need to interpret multiple results in order to provide you with the best plan of care or course of treatment. Therefore, we ask that you please  give [place] 2 business days to thoroughly review all your results before contacting the office for clarification. Should we see a critical lab result, you will be contacted sooner.   If You Need Anything After Your Visit  If you have any questions or concerns for your doctor, please call our main line at [phone] and press option 4 to reach your doctor's medical assistant. If no one answers, please leave a voicemail as directed and we will return your call as soon as possible. Messages left after 4 pm will be answered the following business day.   You may also send [place] a message via MyChart. We typically respond to MyChart messages within 1-2 business days.  For prescription refills, please ask your pharmacy to contact our office. Our fax number is [phone].  If you have an urgent issue when the clinic is closed that cannot wait until the next business day, you can page your doctor at the number below.    Please note that while we do our best to be available for urgent issues outside of office hours, we are not available 24/7.   If you have an urgent issue and are unable to reach [place], you may choose to seek medical care at your doctor's office, retail clinic, urgent care center, or emergency room.  If you have a medical emergency, please immediately call 911 or go to the emergency department.  Pager Numbers  - Dr. Gwen Pounds: [phone]  - Dr. Roseanne Reno: [phone]  - Dr. Katrinka Blazing: [phone]   In the event of inclement weather, please call our main line at [phone] for an update on the status of any delays or closures.  Dermatology Medication Tips: Please keep the boxes that topical medications come in in order to help keep track of the instructions about where and how to use these. Pharmacies typically print the medication instructions only on the boxes and not directly on the medication tubes.   If your medication is too expensive, please contact our office at [phone]  option 4 or send [place] a message through MyChart.   We are unable to tell what your co-pay for medications will be in advance as this is different depending on your insurance coverage. However, we may be able to find a substitute medication at lower cost or fill out paperwork to get insurance to cover a needed medication.   If a prior authorization is required to get your medication covered by your insurance company, please allow [place] 1-2 business days to complete this process.  Drug prices often vary depending on where the prescription is filled and some pharmacies may offer cheaper prices.  The website www.goodrx.com contains coupons for medications through different pharmacies. The prices here do not account for what the cost may be with help from insurance (it may be cheaper with your insurance), but the website can give you the price if you did not use any insurance.  - You can print the associated coupon and take it with your prescription to the pharmacy.  - You may also stop by our office during regular business hours and pick up a GoodRx coupon card.  - If you need your prescription sent electronically to a different  pharmacy, notify our office through [facility] or by phone at [phone] option 4.     Si Usted Necesita Algo Despus de Su Visita  Tambin puede enviarnos un mensaje a travs de Clinical cytogeneticist. Por lo general respondemos a los mensajes de MyChart en el transcurso de 1 a 2 das hbiles.  Para renovar recetas, por favor pida a su farmacia que se ponga en contacto con nuestra oficina. Annie Sable de fax es [place] [phone].  Si tiene un asunto urgente cuando la clnica est cerrada y que no puede esperar hasta el siguiente da hbil, puede llamar/localizar a su doctor(a) al nmero que aparece a continuacin.   Por favor, tenga en cuenta que aunque hacemos todo lo posible para estar disponibles para asuntos urgentes fuera del horario de [place], no estamos disponibles las  24 horas del da, los 7 [address]  [address] de la [place].   Si tiene un problema urgente y no puede comunicarse con nosotros, puede optar por buscar atencin mdica  en el consultorio de su doctor(a), en una clnica privada, en un centro de atencin urgente o en una sala de emergencias.  Si tiene Engineer, drilling, por favor llame inmediatamente al 911 o vaya a la sala de emergencias.  Nmeros de bper  - Dr. Gwen Pounds: [phone]  - Dra. Roseanne Reno: [phone]  - Dr. Katrinka Blazing: [phone]   En caso de inclemencias del tiempo, por favor llame a Lacy Duverney principal al [phone] para una actualizacin sobre el [place] de cualquier retraso o cierre.  Consejos para la medicacin en dermatologa: Por favor, guarde las cajas en las que vienen los medicamentos de uso tpico para ayudarle a seguir las instrucciones sobre dnde y cmo usarlos. Las farmacias generalmente imprimen las instrucciones del medicamento slo en las cajas y no directamente en los tubos del [place].   Si su medicamento es muy caro, por favor, pngase en contacto con Rolm Gala llamando al [phone] y presione la opcin 4 o envenos un mensaje a travs de Clinical cytogeneticist.   No podemos decirle cul ser su copago por los medicamentos por adelantado ya que esto es diferente dependiendo de la cobertura de su seguro. Sin embargo, es posible que podamos encontrar un medicamento sustituto a Audiological scientist un formulario para que el seguro cubra el medicamento que se considera necesario.   Si se requiere una autorizacin previa para que su compaa de seguros [place] su medicamento, por favor permtanos de 1 a 2 das hbiles para completar [address]  Los precios de los medicamentos varan con frecuencia dependiendo del Environmental consultant de dnde se surte la receta y alguna farmacias pueden ofrecer precios ms baratos.  El sitio web www.goodrx.com tiene cupones para medicamentos de Health and safety inspector. Los precios aqu no tienen en cuenta  lo que podra costar con la ayuda del seguro (puede ser ms barato con su seguro), pero el sitio web puede darle el precio si no utiliz Tourist information centre manager.  - Puede imprimir el cupn correspondiente y llevarlo con su receta a la farmacia.  - Tambin puede pasar por nuestra oficina durante el horario de atencin regular y Education officer, museum una tarjeta de cupones de GoodRx.  - Si necesita que su receta se enve electrnicamente a una farmacia diferente, informe a nuestra oficina a travs de MyChart de [place] o por telfono llamando al [phone] y presione la opcin 4.

## 2023-01-18 NOTE — Progress Notes (Unsigned)
   Follow-Up Visit   Subjective  Ricky Rose is a 77 y.o. 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 He is treating with TMC 0.1% cream twice daily alternating every 2 weeks with Cerave Anti-itch. He mainly only has spots left on his legs now.   The following portions of the chart were reviewed this encounter and updated as appropriate: medications, allergies, medical history  Review of Systems:  No other skin or systemic complaints except as noted in HPI or Assessment and Plan.  Objective  Well appearing patient in no apparent distress; mood and affect are within normal limits.   A focused examination was performed of the following areas: trunk and extremities   Relevant exam findings are noted in the Assessment and Plan.  Right lat thigh 8 mm pedunculated crusted papule       Assessment & Plan   Stage 1 Mycosis Fungoides/CTCL Exam: Skin of trunk is clear except for some mild dryness. Pink scaly lichenified plaque of right lat thigh  Treatment Plan: Discontinue TMC 0.1% cream to trunk and arms - may restart with any flares. Contine alternating every 2 weeks with Cerave Anti-itch to legs until clear.  May consider NBUVB light treatments at [facility] in the future.  PROCEDURE NOTE  Neoplasm of uncertain behavior of skin Right lat thigh  Skin / nail biopsy Type of biopsy: tangential   Informed consent: discussed and consent obtained   Timeout: patient name, date of birth, surgical site, and procedure verified   Procedure prep:  Patient was prepped and draped in usual sterile fashion Prep type:  Isopropyl alcohol Anesthesia: the lesion was anesthetized in a standard fashion   Anesthetic:  1% lidocaine w/ epinephrine 1-100,000 buffered w/ 8.4% NaHCO3 Instrument used: flexible razor blade   Hemostasis achieved with: pressure, aluminum chloride and electrodesiccation   Outcome:  patient tolerated procedure well   Post-procedure details: sterile dressing applied and wound care instructions given   Dressing type: bandage and petrolatum    Specimen 1 - Surgical pathology Differential Diagnosis: R/O SCC KA type Check Margins: No    Return in about 3 months (around 04/19/2023) for CTCL.  I, Joanie Coddington, CMA, am acting as scribe for [employer], DO .   Documentation: I have reviewed the above documentation for accuracy and completeness, and I agree with the above.  Langston Reusing, DO

## 2023-01-19 LAB — SURGICAL PATHOLOGY

## 2023-01-20 NOTE — Progress Notes (Signed)
Please refer to Dr. Caralyn Guile for a surgical excision.  Thanks :)  1. Skin, right lat thigh :       WELL DIFFERENTIATED SQUAMOUS CELL CARCINOMA

## 2023-02-03 DIAGNOSIS — K635 Polyp of colon: Secondary | ICD-10-CM | POA: Diagnosis not present

## 2023-02-03 DIAGNOSIS — Z860101 Personal history of adenomatous and serrated colon polyps: Secondary | ICD-10-CM | POA: Diagnosis not present

## 2023-02-03 DIAGNOSIS — D123 Benign neoplasm of transverse colon: Secondary | ICD-10-CM | POA: Diagnosis not present

## 2023-02-05 DIAGNOSIS — D123 Benign neoplasm of transverse colon: Secondary | ICD-10-CM | POA: Diagnosis not present

## 2023-02-05 DIAGNOSIS — K635 Polyp of colon: Secondary | ICD-10-CM | POA: Diagnosis not present

## 2023-02-17 ENCOUNTER — Ambulatory Visit: Payer: Medicare HMO | Admitting: Dermatology

## 2023-02-17 ENCOUNTER — Encounter: Payer: Self-pay | Admitting: Dermatology

## 2023-02-17 VITALS — BP 132/86 | HR 83

## 2023-02-17 DIAGNOSIS — L988 Other specified disorders of the skin and subcutaneous tissue: Secondary | ICD-10-CM | POA: Diagnosis not present

## 2023-02-17 DIAGNOSIS — C4492 Squamous cell carcinoma of skin, unspecified: Secondary | ICD-10-CM

## 2023-02-17 DIAGNOSIS — C44622 Squamous cell carcinoma of skin of right upper limb, including shoulder: Secondary | ICD-10-CM | POA: Diagnosis not present

## 2023-02-17 NOTE — Progress Notes (Signed)
   Follow-Up Visit   Subjective  Ricky Rose is a 77 y.o. male who presents for the following: Excision of Well Differentiated SCC on the right lateral thigh, bxed by Dr. Langston Reusing  The following portions of the chart were reviewed this encounter and updated as appropriate: medications, allergies, medical history  Review of Systems:  No other skin or systemic complaints except as noted in HPI or Assessment and Plan.  Objective  Well appearing patient in no apparent distress; mood and affect are within normal limits.  A focused examination was performed of the following areas: Right lateral thigh Relevant physical exam findings are noted in the Assessment and Plan.   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  Skin excision  Lesion length (cm):  1.5 Lesion width (cm):  1.9 Margin per side (cm):  0.6 Total excision diameter (cm):  3.1 Informed consent: discussed and consent obtained   Timeout: patient name, date of birth, surgical site, and procedure verified   Procedure prep:  Patient was prepped and draped in usual sterile fashion Prep type:  Isopropyl alcohol and chlorhexidine Anesthesia: the lesion was anesthetized in a standard fashion   Anesthetic:  1% lidocaine w/ epinephrine 1-100,000 local infiltration Instrument used: #15 blade   Hemostasis achieved with: suture and electrodesiccation   Hemostasis achieved with comment:  3.0 PDS,5.0 prolene Outcome: patient tolerated procedure well with no complications   Post-procedure details: sterile dressing applied and wound care instructions given    Skin repair Complexity:  Intermediate Final length (cm):  6 Informed consent: discussed and consent obtained   Timeout: patient name, date of birth, surgical site, and procedure verified   Procedure prep:  Patient was prepped and draped in usual sterile fashion Prep type:  Chlorhexidine Anesthesia:  the lesion was anesthetized in a standard fashion   Reason for type of repair: reduce tension to allow closure, reduce the risk of dehiscence, infection, and necrosis, allow closure of the large defect, preserve normal anatomy and allow side-to-side closure without requiring a flap or graft   Undermining: edges undermined   Subcutaneous layers (deep stitches):  Suture size:  3-0 Suture type: PDS (polydioxanone)   Stitches:  Buried vertical mattress Fine/surface layer approximation (top stitches):  Suture size:  4-0 Suture type: Prolene (polypropylene)   Stitches: vertical mattress   Suture removal (days):  14 Hemostasis achieved with: suture and electrodesiccation Outcome: patient tolerated procedure well with no complications   Post-procedure details: sterile dressing applied and wound care instructions given   Dressing type: pressure dressing and bacitracin    Specimen 1 - Surgical pathology Differential Diagnosis: SCC [id number]  Check Margins: No     Return in about 2 weeks (around 03/03/2023) for wound check.  I, Tillie Fantasia, CMA, am acting as scribe for Gwenith Daily, MD.   Documentation: I have reviewed the above documentation for accuracy and completeness, and I agree with the above.  Gwenith Daily, MD

## 2023-02-17 NOTE — Patient Instructions (Signed)

## 2023-02-19 LAB — SURGICAL PATHOLOGY

## 2023-03-03 ENCOUNTER — Encounter: Payer: Self-pay | Admitting: Dermatology

## 2023-03-03 ENCOUNTER — Ambulatory Visit: Payer: Medicare HMO | Admitting: Dermatology

## 2023-03-03 DIAGNOSIS — C4492 Squamous cell carcinoma of skin, unspecified: Secondary | ICD-10-CM

## 2023-03-03 DIAGNOSIS — Z85828 Personal history of other malignant neoplasm of skin: Secondary | ICD-10-CM

## 2023-03-03 DIAGNOSIS — L905 Scar conditions and fibrosis of skin: Secondary | ICD-10-CM

## 2023-03-03 NOTE — Patient Instructions (Signed)

## 2023-03-03 NOTE — Progress Notes (Signed)
   Follow-Up Visit   Subjective  Ricky Rose is a 77 y.o. male who presents for the following: wound check from excision on right thigh for SCC  Pt here today for follow up to excision of right lateral thigh.   The following portions of the chart were reviewed this encounter and updated as appropriate: medications, allergies, medical history  Review of Systems:  No other skin or systemic complaints except as noted in HPI or Assessment and Plan.  Objective  Well appearing patient in no apparent distress; mood and affect are within normal limits.  A focused examination was performed of the following areas: Right lateral thigh  Relevant exam findings are noted in the Assessment and Plan.   Assessment & Plan   Wound check s/p excision Exam: well healing surgical site - Reassured wound is healing well - Ok to discontinue bandaging and ointment - Vertical mattress sutures removed - OK to resume normal activities  Return in about 6 months (around 08/31/2023) for TBSE.  I, Tillie Fantasia, CMA, am acting as scribe for Gwenith Daily, MD.   Documentation: I have reviewed the above documentation for accuracy and completeness, and I agree with the above.  Gwenith Daily, MD

## 2023-04-15 ENCOUNTER — Ambulatory Visit: Payer: Medicare HMO | Admitting: Dermatology

## 2023-04-15 ENCOUNTER — Encounter: Payer: Self-pay | Admitting: Dermatology

## 2023-04-15 VITALS — BP 126/86 | HR 89

## 2023-04-15 DIAGNOSIS — Z85828 Personal history of other malignant neoplasm of skin: Secondary | ICD-10-CM | POA: Diagnosis not present

## 2023-04-15 DIAGNOSIS — Z8589 Personal history of malignant neoplasm of other organs and systems: Secondary | ICD-10-CM

## 2023-04-15 DIAGNOSIS — C8400 Mycosis fungoides, unspecified site: Secondary | ICD-10-CM | POA: Diagnosis not present

## 2023-04-15 DIAGNOSIS — C8590 Non-Hodgkin lymphoma, unspecified, unspecified site: Secondary | ICD-10-CM

## 2023-04-15 NOTE — Progress Notes (Signed)
   Follow-Up Visit   Subjective  Ricky Rose is a 77 y.o. male who presents for the following: CTCL  Patient present today for follow up visit for CTCL. Patient was last evaluated on 01/18/23. He was instructed to continue [facility] and alternate with Cerave anti-itch. Patient states he has continued the recommended instructions. Patient reports sxs are  improved . Patient denies medication changes.  The following portions of the chart were reviewed this encounter and updated as appropriate: medications, allergies, medical history  Review of Systems:  No other skin or systemic complaints except as noted in HPI or Assessment and Plan.  Objective  Well appearing patient in no apparent distress; mood and affect are within normal limits.  A focused examination was performed of the following areas: Arms, Chest and legs  Relevant exam findings are noted in the Assessment and Plan.    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   - No lymphadenopathy  Stable  Treatment Plan: - Recommended Applying Clobetasol Cream for about 1 week than STOP and apply only Cerva Anti itch - May consider NBUVB light treatments at [facility] in the future  HISTORY OF SQUAMOUS CELL CARCINOMA OF THE SKIN  Exam: Well healed scar on Right lateral Thigh  Treatment Plan: - No evidence of recurrence today - Recommend regular full body skin exams - Recommend daily broad spectrum sunscreen SPF 30+ to sun-exposed areas, reapply every 2 hours as needed.  - Call if any new or changing lesions are noted between office visits      Return in about 4 months (around 08/14/2023) for CTCL F/U.    Documentation: I have reviewed the above documentation for accuracy and completeness, and I agree with the above.  Langston Reusing, DO

## 2023-04-15 NOTE — Patient Instructions (Addendum)
Hello Ricky Rose,  Thank you for visiting my office today. Your dedication to managing your skin condition and improving your health is greatly appreciated. Here is a summary of the key instructions from today's consultation:  - Clobetasol and Tacrolimus: Continue using as directed for your cutaneous T-cell lymphoma (CTCL). Given the improvement in your skin, consider taking a break from these creams to observe any changes.  - Clobetasol Application: Apply to the erythematous plaques behind your knee for 2 weeks, then pause its use.  - CeraVe Anti-Itch Lotion: Use daily, especially after showering, to manage dryness and mild itchiness. This lotion contains pramoxine, which helps soothe the skin.  Sherlynn Stalls Therapy: We will hold off on this treatment as your condition is currently stable.  - Follow-Up: Schedule a follow-up appointment in 3 months to reassess your condition and treatment plan.  Thank you for your cooperation. I look forward to seeing you at the next appointment and continuing to support your journey towards better skin health.  Best regards,  Dr. Langston Reusing Dermatology    Important Information   Due to recent changes in healthcare laws, you may see results of your pathology and/or laboratory studies on MyChart before the doctors have had a chance to review them. We understand that in some cases there may be results that are confusing or concerning to you. Please understand that not all results are received at the same time and often the doctors may need to interpret multiple results in order to provide you with the best plan of care or course of treatment. Therefore, we ask that you please give [place] 2 business days to thoroughly review all your results before contacting the office for clarification. Should we see a critical lab result, you will be contacted sooner.     If You Need Anything After Your Visit   If you have any questions or concerns for your doctor, please call  our main line at [phone]. If no one answers, please leave a voicemail as directed and we will return your call as soon as possible. Messages left after 4 pm will be answered the following business day.    You may also send [place] a message via MyChart. We typically respond to MyChart messages within 1-2 business days.  For prescription refills, please ask your pharmacy to contact our office. Our fax number is [phone].  If you have an urgent issue when the clinic is closed that cannot wait until the next business day, you can page your doctor at the number below.     Please note that while we do our best to be available for urgent issues outside of office hours, we are not available 24/7.    If you have an urgent issue and are unable to reach [place], you may choose to seek medical care at your doctor's office, retail clinic, urgent care center, or emergency room.   If you have a medical emergency, please immediately call 911 or go to the emergency department. In the event of inclement weather, please call our main line at [phone] for an update on the status of any delays or closures.  Dermatology Medication Tips: Please keep the boxes that topical medications come in in order to help keep track of the instructions about where and how to use these. Pharmacies typically print the medication instructions only on the boxes and not directly on the medication tubes.   If your medication is too expensive, please contact our office at [phone] or send  [place] a message through MyChart.    We are unable to tell what your co-pay for medications will be in advance as this is different depending on your insurance coverage. However, we may be able to find a substitute medication at lower cost or fill out paperwork to get insurance to cover a needed medication.    If a prior authorization is required to get your medication covered by your insurance company, please allow [place] 1-2 business days to complete  this process.   Drug prices often vary depending on where the prescription is filled and some pharmacies may offer cheaper prices.   The website www.goodrx.com contains coupons for medications through different pharmacies. The prices here do not account for what the cost may be with help from insurance (it may be cheaper with your insurance), but the website can give you the price if you did not use any insurance.  - You can print the associated coupon and take it with your prescription to the pharmacy.  - You may also stop by our office during regular business hours and pick up a GoodRx coupon card.  - If you need your prescription sent electronically to a different pharmacy, notify our office through [facility] or by phone at [phone]

## 2023-07-14 DIAGNOSIS — E7800 Pure hypercholesterolemia, unspecified: Secondary | ICD-10-CM | POA: Diagnosis not present

## 2023-07-14 DIAGNOSIS — I10 Essential (primary) hypertension: Secondary | ICD-10-CM | POA: Diagnosis not present

## 2023-08-17 ENCOUNTER — Ambulatory Visit (INDEPENDENT_AMBULATORY_CARE_PROVIDER_SITE_OTHER): Payer: Medicare HMO | Admitting: Dermatology

## 2023-08-17 ENCOUNTER — Encounter: Payer: Self-pay | Admitting: Dermatology

## 2023-08-17 VITALS — BP 106/70

## 2023-08-17 DIAGNOSIS — W098XXA Fall on or from other playground equipment, initial encounter: Secondary | ICD-10-CM | POA: Diagnosis not present

## 2023-08-17 DIAGNOSIS — C8400 Mycosis fungoides, unspecified site: Secondary | ICD-10-CM

## 2023-08-17 DIAGNOSIS — C84AA Cutaneous t-cell lymphoma, unspecified, in remission: Secondary | ICD-10-CM | POA: Diagnosis not present

## 2023-08-17 DIAGNOSIS — L905 Scar conditions and fibrosis of skin: Secondary | ICD-10-CM

## 2023-08-17 DIAGNOSIS — L814 Other melanin hyperpigmentation: Secondary | ICD-10-CM

## 2023-08-17 DIAGNOSIS — Z1283 Encounter for screening for malignant neoplasm of skin: Secondary | ICD-10-CM | POA: Diagnosis not present

## 2023-08-17 DIAGNOSIS — L853 Xerosis cutis: Secondary | ICD-10-CM | POA: Diagnosis not present

## 2023-08-17 DIAGNOSIS — L578 Other skin changes due to chronic exposure to nonionizing radiation: Secondary | ICD-10-CM

## 2023-08-17 DIAGNOSIS — L821 Other seborrheic keratosis: Secondary | ICD-10-CM | POA: Diagnosis not present

## 2023-08-17 DIAGNOSIS — Z85828 Personal history of other malignant neoplasm of skin: Secondary | ICD-10-CM | POA: Diagnosis not present

## 2023-08-17 DIAGNOSIS — D1801 Hemangioma of skin and subcutaneous tissue: Secondary | ICD-10-CM

## 2023-08-17 DIAGNOSIS — D229 Melanocytic nevi, unspecified: Secondary | ICD-10-CM

## 2023-08-17 DIAGNOSIS — Z8589 Personal history of malignant neoplasm of other organs and systems: Secondary | ICD-10-CM

## 2023-08-17 NOTE — Patient Instructions (Addendum)
 Hello Ricky Rose,  Thank you for visiting today. Here is a summary of the key instructions:  - Skin Care:   - Use CeraVe moisturizer daily   - Apply triamcinolone  alone cream for up to 2 weeks if scaly patches re-appear on belly or legs   - Take a break from trimethicin alone after 2 weeks of use  - Sun Protection:   - Apply sunscreen to face, neck, and hands every day   - Use thicker sunscreens for beach trips   - Use lighter sunscreens for everyday use   - Try different sunscreen samples to find the right one  - Follow-up:   - Return for a follow-up appointment in 6 months  - Monitoring:   - Watch for any new bumps or spots on the skin   - Contact the office if you notice any concerning changes  - General Instructions:   - Continue current skin care routine   - Enjoy summer activities while being mindful of sun protection  We look forward to seeing you at your next visit. If you have any questions or concerns before then, please do not hesitate to contact our office.  Warm regards,  Dr. Louana Roup Dermatology   Important Information  Due to recent changes in healthcare laws, you may see results of your pathology and/or laboratory studies on MyChart before the doctors have had a chance to review them. We understand that in some cases there may be results that are confusing or concerning to you. Please understand that not all results are received at the same time and often the doctors may need to interpret multiple results in order to provide you with the best plan of care or course of treatment. Therefore, we ask that you please give us  2 business days to thoroughly review all your results before contacting the office for clarification. Should we see a critical lab result, you will be contacted sooner.   If You Need Anything After Your Visit  If you have any questions or concerns for your doctor, please call our main line at [phone] If no one answers, please leave a  voicemail as directed and we will return your call as soon as possible. Messages left after 4 pm will be answered the following business day.   You may also send us  a message via MyChart. We typically respond to MyChart messages within 1-2 business days.  For prescription refills, please ask your pharmacy to contact our office. Our fax number is [phone].  If you have an urgent issue when the clinic is closed that cannot wait until the next business day, you can page your doctor at the number below.    Please note that while we do our best to be available for urgent issues outside of office hours, we are not available 24/7.   If you have an urgent issue and are unable to reach us , you may choose to seek medical care at your doctor's office, retail clinic, urgent care center, or emergency room.  If you have a medical emergency, please immediately call 911 or go to the emergency department. In the event of inclement weather, please call our main line at [phone] for an update on the status of any delays or closures.  Dermatology Medication Tips: Please keep the boxes that topical medications come in in order to help keep track of the instructions about where and how to use these. Pharmacies typically print the medication instructions only on the boxes and not  directly on the medication tubes.   If your medication is too expensive, please contact our office at [phone] or send us  a message through MyChart.   We are unable to tell what your co-pay for medications will be in advance as this is different depending on your insurance coverage. However, we may be able to find a substitute medication at lower cost or fill out paperwork to get insurance to cover a needed medication.   If a prior authorization is required to get your medication covered by your insurance company, please allow us  1-2 business days to complete this process.  Drug prices often vary depending on where the  prescription is filled and some pharmacies may offer cheaper prices.  The website www.goodrx.com contains coupons for medications through different pharmacies. The prices here do not account for what the cost may be with help from insurance (it may be cheaper with your insurance), but the website can give you the price if you did not use any insurance.  - You can print the associated coupon and take it with your prescription to the pharmacy.  - You may also stop by our office during regular business hours and pick up a GoodRx coupon card.  - If you need your prescription sent electronically to a different pharmacy, notify our office through [facility] or by phone at [phone]

## 2023-08-17 NOTE — Progress Notes (Signed)
   Follow-Up Visit   Subjective  Ricky Rose is a 78 y.o. male who presents for the following: CTCL  Patient present today for follow up visit. Patient was last evaluated on 04/15/23. At this visit patient was prescribed Clobetasol (used for 1 week) and using Cerva Anti itch. Patient reports sxs are better. Patient denies medication changes.  The following portions of the chart were reviewed this encounter and updated as appropriate: medications, allergies, medical history  Review of Systems:  No other skin or systemic complaints except as noted in HPI or Assessment and Plan.  Objective  Well appearing patient in no apparent distress; mood and affect are within normal limits.   A focused examination was performed of the following areas: arms, chest, legs   Relevant exam findings are noted in the Assessment and Plan.          Assessment & Plan     Cutaneous T-cell Lymphoma (CTCL) in Remission - Assessment: Patient has a history of CTCL,  Today's examination shows stable and clear skin. CTCL lesions on the abdomen, chest, and legs have resolved. The patient reports no recent flares or need for steroid cream use.  - Plan:    Continue using CeraVe moisturizer as primary skin care    Use triamcinolone  cream for up to 2 weeks if scaly plaques recur on belly or legs    Follow up in 6 months for CTCL monitoring   HISTORY OF SQUAMOUS CELL CARCINOMA OF THE SKIN - Well healed scar on the R thigh - No evidence of recurrence today - No lymphadenopathy - Recommend regular full body skin exams - Recommend daily broad spectrum sunscreen SPF 30+ to sun-exposed areas, reapply every 2 hours as needed.  - Call if any new or changing lesions are noted between office visits  LENTIGINES, SEBORRHEIC KERATOSES, HEMANGIOMAS - Benign normal skin lesions - Benign-appearing - Call for any changes  BENIGN MELANOCYTIC NEVI - Tan-brown and/or pink-flesh-colored symmetric macules and papules -  Benign appearing on exam today - Observation - Call clinic for new or changing moles - Recommend daily use of broad spectrum spf 30+ sunscreen to sun-exposed areas.   MODERATE ACTINIC DAMAGE - Chronic condition, secondary to cumulative UV/sun exposure - diffuse scaly erythematous macules with underlying dyspigmentation - Recommend daily broad spectrum sunscreen SPF 30+ to sun-exposed areas, reapply every 2 hours as needed.  - Staying in the shade or wearing long sleeves, sun glasses (UVA+UVB protection) and wide brim hats (4-inch brim around the entire circumference of the hat) are also recommended for sun protection.  - Call for new or changing lesions.  Xerosis - diffuse xerotic patches - recommend gentle, hydrating skin care - gentle skin care handout given  SKIN CANCER SCREENING PERFORMED TODAY SKIN EXAM FOR MALIGNANT NEOPLASM   MULTIPLE BENIGN MELANOCYTIC NEVI   CHERRY ANGIOMA   LENTIGINES   SEBORRHEIC KERATOSIS   ACTINIC SKIN DAMAGE   STAGE 1 MYCOSIS FUNGOIDES (HCC)   HISTORY OF SQUAMOUS CELL CARCINOMA    Return in about 6 months (around 02/16/2024) for CTCL.    Documentation: I have reviewed the above documentation for accuracy and completeness, and I agree with the above.   I, Shirron Louanne Roussel, CMA, am acting as scribe for [employer], DO.   Louana Roup, DO

## 2024-01-25 DIAGNOSIS — I10 Essential (primary) hypertension: Secondary | ICD-10-CM | POA: Diagnosis not present

## 2024-01-25 DIAGNOSIS — Z0000 Encounter for general adult medical examination without abnormal findings: Secondary | ICD-10-CM | POA: Diagnosis not present

## 2024-01-25 DIAGNOSIS — L309 Dermatitis, unspecified: Secondary | ICD-10-CM | POA: Diagnosis not present

## 2024-02-16 ENCOUNTER — Ambulatory Visit: Admitting: Dermatology

## 2024-04-06 DIAGNOSIS — Z23 Encounter for immunization: Secondary | ICD-10-CM | POA: Diagnosis not present

## 2024-07-13 ENCOUNTER — Ambulatory Visit: Admitting: Dermatology
